# Patient Record
Sex: Female | Born: 1985 | Race: White | Hispanic: No | Marital: Single | Smoking: Never smoker
Health system: Southern US, Community
[De-identification: ages and names within clinical notes are randomized; demographics above are authoritative.]

---
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
## 2006-01-26 ENCOUNTER — Emergency Department (HOSPITAL_COMMUNITY): Admission: EM | Admit: 2006-01-26 | Discharge: 2006-01-26 | Payer: Self-pay | Admitting: Emergency Medicine

## 2008-09-29 ENCOUNTER — Observation Stay (HOSPITAL_COMMUNITY): Admission: EM | Admit: 2008-09-29 | Discharge: 2008-10-02 | Payer: Self-pay | Admitting: Emergency Medicine

## 2008-09-29 ENCOUNTER — Ambulatory Visit: Payer: Self-pay | Admitting: Internal Medicine

## 2008-10-12 ENCOUNTER — Ambulatory Visit: Payer: Self-pay | Admitting: Gynecology

## 2008-11-04 ENCOUNTER — Ambulatory Visit: Payer: Self-pay | Admitting: Gynecology

## 2009-02-24 ENCOUNTER — Encounter: Admission: RE | Admit: 2009-02-24 | Discharge: 2009-02-24 | Payer: Self-pay | Admitting: Internal Medicine

## 2010-02-01 ENCOUNTER — Encounter: Payer: Self-pay | Admitting: Family Medicine

## 2010-02-17 ENCOUNTER — Ambulatory Visit: Payer: Self-pay | Admitting: Sports Medicine

## 2010-02-17 DIAGNOSIS — M21069 Valgus deformity, not elsewhere classified, unspecified knee: Secondary | ICD-10-CM | POA: Insufficient documentation

## 2010-02-17 DIAGNOSIS — R269 Unspecified abnormalities of gait and mobility: Secondary | ICD-10-CM | POA: Insufficient documentation

## 2010-02-17 DIAGNOSIS — M2140 Flat foot [pes planus] (acquired), unspecified foot: Secondary | ICD-10-CM | POA: Insufficient documentation

## 2010-02-17 DIAGNOSIS — M79609 Pain in unspecified limb: Secondary | ICD-10-CM | POA: Insufficient documentation

## 2010-05-05 ENCOUNTER — Emergency Department (HOSPITAL_COMMUNITY): Admission: EM | Admit: 2010-05-05 | Discharge: 2010-05-05 | Payer: Self-pay | Admitting: Emergency Medicine

## 2010-12-20 NOTE — Assessment & Plan Note (Signed)
Summary: ORTHOTICS,MC   Vital Signs:  Patient profile:   25 year old female Height:      67 inches Weight:      165 pounds BMI:     25.94 BP sitting:   106 / 69  Vitals Entered By: Lillia Pauls CMA (February 17, 2010 2:42 PM)  History of Present Illness: Pt presents for left mid arch and ankle pain that has gotten worse as she has been training for half marathon. She has felt the pain getting worse since she was seen at [facility] for long arch collapse and pes planus. Her half marathon is the [employer] at the end of April. The pain typically develops after she starts her run and persists during her run. She has been training quite a bit and has run already up to 17 miles. She has also developed some left knee and hip pain since she developed the foot and ankle pain. She describes the pain as pressure-like.  Allergies (verified): No Known Drug Allergies  Physical Exam  General:  alert and well-developed.   Head:  normocephalic and atraumatic.   Neck:  supple.   Lungs:  normal respiratory effort.   Msk:  Ankle and Feet: Pes planus bilaterally Hindfoot valgus of the left foot Pronates with her right foot Significant genu valgum, left worse than right dynamically Bunionettes bilaterally Normal inspection otherwise Full ROM of ankles in all direction 5/5 strength with resisted ankle ROM testing No TTP throughout Able to bear weight easily  Gait: Significant genu valgum bilaterally, left worse than right. Lateral motion of bilateral knees when walking.   Hip: 4/5 strength with resisted hip flexion and abduction   Impression & Recommendations:  Problem # 1:  PES PLANUS (ICD-734)  1. Made custom orthotics for the patient so that she can run in the race later this month  Patient was fitted for a : standard, cushioned, semi-rigid orthotic. The orthotic was heated and afterward the patient stood on the orthotic blank positioned on the orthotic stand. The patient was  positioned in subtalar neutral position and 10 degrees of ankle dorsiflexion in a weight bearing stance. After completion of molding, a stable base was applied to the orthotic blank. The blank was ground to a stable position for weight bearing. Size: 9 red cushioned Base: Med blue EVA Posting: Arch support Additional orthotic padding: Heel wedge under the left orthotic  Comfortable prior to leaving the office  Orders: Orthotic Materials, each unit (L3002)  Problem # 2:  GENU VALGUM (ICD-736.41)  1. Given heel wedge for her left orthotic 2. Given hip strengthening exercise to do 3. Return in 4 weeks for follow-up  Orders: Orthotic Materials, each unit [phone])  Problem # 3:  FOOT PAIN (ICD-729.5) See above for treatment plan  Problem # 4:  GAIT DISTURBANCE (ICD-781.2)  Orthotics for pes planus and genu valgum  Orders: Orthotic Materials, each unit (U0454)

## 2010-12-20 NOTE — Letter (Signed)
Summary: [employer] at [facility] at [facility]   Imported By: Marily Memos 02/24/2010 15:22:37  _____________________________________________________________________  External Attachment:    Type:   Image     Comment:   External Document

## 2010-12-25 ENCOUNTER — Emergency Department (HOSPITAL_COMMUNITY)
Admission: EM | Admit: 2010-12-25 | Discharge: 2010-12-25 | Disposition: A | Discharge status: Home / Self Care | Payer: PRIVATE HEALTH INSURANCE | Attending: Emergency Medicine | Admitting: Emergency Medicine

## 2010-12-25 DIAGNOSIS — K5900 Constipation, unspecified: Secondary | ICD-10-CM | POA: Insufficient documentation

## 2010-12-25 DIAGNOSIS — R110 Nausea: Secondary | ICD-10-CM | POA: Insufficient documentation

## 2010-12-25 DIAGNOSIS — R109 Unspecified abdominal pain: Secondary | ICD-10-CM | POA: Insufficient documentation

## 2011-01-18 ENCOUNTER — Other Ambulatory Visit: Payer: Self-pay | Admitting: Gastroenterology

## 2011-01-24 ENCOUNTER — Encounter: Payer: Self-pay | Admitting: Family Medicine

## 2011-01-24 ENCOUNTER — Ambulatory Visit (INDEPENDENT_AMBULATORY_CARE_PROVIDER_SITE_OTHER): Payer: PRIVATE HEALTH INSURANCE | Admitting: Family Medicine

## 2011-01-24 DIAGNOSIS — M7750 Other enthesopathy of unspecified foot: Secondary | ICD-10-CM

## 2011-01-24 DIAGNOSIS — M722 Plantar fascial fibromatosis: Secondary | ICD-10-CM

## 2011-01-24 DIAGNOSIS — R269 Unspecified abnormalities of gait and mobility: Secondary | ICD-10-CM

## 2011-01-27 ENCOUNTER — Other Ambulatory Visit: Payer: PRIVATE HEALTH INSURANCE

## 2011-01-31 NOTE — Assessment & Plan Note (Signed)
Summary: NP,RT FOOT PAIN PER Briah Nary,MC   Vital Signs:  Patient profile:   25 year old female Height:      67 inches Weight:      163 pounds BMI:     25.62 Pulse rate:   77 / minute BP sitting:   109 / 69  (right arm)  Vitals Entered By: Rochele Pages RN (January 24, 2011 4:04 PM) CC: RT FOOT 2ND METATARSAL AND TOE PAIN   CC:  RT FOOT 2ND METATARSAL AND TOE PAIN.  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  Has experienced increasing Rt foot pain along 2nd MT & 2nd toe x 34-months.  Started while training for a 1/2 marathon, but has not been improving.  Denies any specific injury.  Denies any significant swelling or bruising.  Has been unable to run due to the pain, but has been biking & using elliptical.  Was seen by me 1-week ago at [facility] and felt to be related to metatarsalgia.  She has custom orthotics made in our office about a year ago, but did not have them for examination at [facility] office visit.  Was previously running about 40-miles/wk.  Also c/o left heel pain related to plantar fasciitis.  Orthotics have been somewhat helpful.  Is performing ice massage periodically with frozen water bottle.  Is not doing any PF exercises/stretches.  This is currently less bothersome than right foot pain.  Preventive Screening-Counseling & Management  Alcohol-Tobacco     Smoking Status: never  Social History: Smoking Status:  never  Review of Systems       per HPI, otherwise ROS negative  Physical Exam  General:  Well-developed,well-nourished,in no acute distress; alert,appropriate and cooperative throughout examination Msk:  ANKLES: FROM b/l without pain.  no tenderness, weakness, or laxity.  FEET:  - R foot: pes planus, over pronation, collapse of transverse arch, hindfoot valgus.  Callous along 2nd & 3rd MT heads, TTP along 2nd & 3rd MT heads.  Mildly TTP at 2nd MTP joint & along 2nd MT.  Neg metatarsal squeeze.   Able to perform hop test with some pain.  Minimal tenderness along insertion of PF.  - L foot: pes planus, over pronation, mild collapse of transverse arch with slight splaying between 1st & 2nd toes, hindfoot valgus.  No significant callous formation.  No tenderness along MT heads, MTs, or midfoot.  TTP along insertion of PF along medial calcaneous.  GAIT: no leg length difference.  b/l genu valgum, over pronation b/l - right>left.  Good running form, no limp.  Good hip ROM & good hip strength Pulses:  2+ DP & PT b/l Neurologic:  sensation intact to light touch.   Additional Exam:  Exam of custom orthotics shows them to be in good shape.  Pt removed medial heel wedge from left insert several months ago.  MSK U/S: Exam of right foot showed normal appearing MTs without any cortical irregularities or surrounding edema.  No signs of fracture.  Slight amount of increase fluid at 2nd MTP joint, but no signs of fracture.  No increased doppler flow in this area.  PF measured 0.30 cm.  Images saved.  Exam of left foot showed slightly thickened PF compared to right, measuring 0.40 cm.  No signs of tearing.  Images saved.   Impression & Recommendations:  Problem # 1:  METATARSALGIA (ICD-726.70)  - Metatarsalgia of right foot along 2nd/3rd MTs, may have  mild synovitis at 2nd MTP joint based on MSK u/s, no signs of stress fx - Orthotic fitted with MT pad which was comfortable in office.  Given additional pads to place in shoes that orthotics will not fit into - Also fitted with arch straps - Cont mobic prn - May start to ease back into running, reviewed walk/run program.  Cont. cross-training - f/u 3 weeks for re-evaluation  Orders: [place] LIMITED ([id number]) Metatarsal Pads (J8119) Posting Heel Wedges (L3350)  Problem # 2:  FASCIITIS, PLANTAR (ICD-728.71) - Mainly on left, mild tenderness on right.  MSK u/s showed thickness 0.4cm on left & 0.30 cm on right - Cont. to use custom orthotics & wear  supportive footwear - Fitted with arch straps today - Cont. ice massages - Given handout & reviewed PF exercises - should do these twice daily - cont Mobic prn - f/u 3 weeks for re-evaluation  Orders: Foot Orthosis ( Arch Strap/Heel Cup) [phone]) [place] LIMITED [phone]) Metatarsal Pads [phone]) Posting Heel Wedges [phone])  Problem # 3:  ABNORMALITY OF GAIT (ICD-781.2)  - b/l over pronation & b/l genu valgum - orthotics adjusted with medial heel wedge b/l - correction comfortable to patient in office & gait more neutral  Orders: [place] LIMITED (62952) Metatarsal Pads (W4132) Posting Heel Wedges (G4010)   Orders Added: 1)  Foot Orthosis ( Arch Strap/Heel Cup) [U7253] 2)  Est. Patient Level III [66440] 3)  [place] LIMITED [[id number]] 4)  Metatarsal Pads [L3649] 5)  Posting Heel Wedges [L3350]

## 2011-02-06 LAB — DIFFERENTIAL
Eosinophils Absolute: 0.2 10*3/uL (ref 0.0–0.7)
Eosinophils Relative: 2 % (ref 0–5)
Lymphs Abs: 2.4 10*3/uL (ref 0.7–4.0)
Monocytes Relative: 7 % (ref 3–12)
Neutro Abs: 3.6 10*3/uL (ref 1.7–7.7)
Neutrophils Relative %: 54 % (ref 43–77)

## 2011-02-06 LAB — URINALYSIS, ROUTINE W REFLEX MICROSCOPIC
Bilirubin Urine: NEGATIVE
Hgb urine dipstick: NEGATIVE
Urobilinogen, UA: 0.2 mg/dL (ref 0.0–1.0)

## 2011-02-06 LAB — BASIC METABOLIC PANEL
BUN: 11 mg/dL (ref 6–23)
CO2: 27 mEq/L (ref 19–32)
Creatinine, Ser: 0.73 mg/dL (ref 0.4–1.2)
GFR calc non Af Amer: >60 mL/min (ref >60)

## 2011-02-06 LAB — CBC
Hemoglobin: 14.9 g/dL (ref 12.0–15.0)
MCHC: 34.5 g/dL (ref 30.0–36.0)

## 2011-03-04 ENCOUNTER — Emergency Department (HOSPITAL_COMMUNITY): Payer: PRIVATE HEALTH INSURANCE

## 2011-03-04 ENCOUNTER — Emergency Department (HOSPITAL_COMMUNITY)
Admission: EM | Admit: 2011-03-04 | Discharge: 2011-03-04 | Disposition: A | Discharge status: Home / Self Care | Payer: PRIVATE HEALTH INSURANCE | Attending: Emergency Medicine | Admitting: Emergency Medicine

## 2011-03-04 ENCOUNTER — Encounter (HOSPITAL_COMMUNITY): Payer: Self-pay | Admitting: Radiology

## 2011-03-04 DIAGNOSIS — R10819 Abdominal tenderness, unspecified site: Secondary | ICD-10-CM | POA: Insufficient documentation

## 2011-03-04 DIAGNOSIS — K5900 Constipation, unspecified: Secondary | ICD-10-CM | POA: Insufficient documentation

## 2011-03-04 DIAGNOSIS — F411 Generalized anxiety disorder: Secondary | ICD-10-CM | POA: Insufficient documentation

## 2011-03-04 DIAGNOSIS — J45909 Unspecified asthma, uncomplicated: Secondary | ICD-10-CM | POA: Insufficient documentation

## 2011-03-04 DIAGNOSIS — R109 Unspecified abdominal pain: Secondary | ICD-10-CM | POA: Insufficient documentation

## 2011-03-04 LAB — COMPREHENSIVE METABOLIC PANEL
AST: 27 U/L (ref 0–37)
BUN: 14 mg/dL (ref 6–23)
Calcium: 8.9 mg/dL (ref 8.4–10.5)
Chloride: 105 mEq/L (ref 96–112)
GFR calc Af Amer: >60 mL/min (ref >60)
GFR calc non Af Amer: >60 mL/min (ref >60)
Glucose, Bld: 112 mg/dL — ABNORMAL HIGH (ref 70–99)
Sodium: 139 mEq/L (ref 135–145)
Total Protein: 6 g/dL (ref 6.0–8.3)

## 2011-03-04 LAB — URINALYSIS, ROUTINE W REFLEX MICROSCOPIC
Glucose, UA: NEGATIVE mg/dL
Nitrite: NEGATIVE

## 2011-03-04 LAB — DIFFERENTIAL
Eosinophils Absolute: 0.3 10*3/uL (ref 0.0–0.7)
Eosinophils Relative: 4 % (ref 0–5)
Lymphocytes Relative: 34 % (ref 12–46)
Lymphs Abs: 2.2 10*3/uL (ref 0.7–4.0)
Monocytes Absolute: 0.4 10*3/uL (ref 0.1–1.0)
Neutro Abs: 3.5 10*3/uL (ref 1.7–7.7)

## 2011-03-04 LAB — CBC
Hemoglobin: 14.8 g/dL (ref 12.0–15.0)
MCHC: 33.7 g/dL (ref 30.0–36.0)
MCV: 92.6 fL (ref 78.0–100.0)
RBC: 4.74 MIL/uL (ref 3.87–5.11)
WBC: 6.4 10*3/uL (ref 4.0–10.5)

## 2011-03-04 MED ORDER — IOHEXOL 300 MG/ML  SOLN
100.0000 mL | Freq: Once | INTRAMUSCULAR | Status: AC | PRN
  Administered 2011-03-04: 90 mL via INTRAVENOUS

## 2011-04-04 NOTE — Discharge Summary (Signed)
Robyn Garcia, Robyn Garcia   MEDICAL RECORD NO.:  [id number]          PATIENT TYPE:  OBV   LOCATION:  5030                         FACILITY:  [facility]   PHYSICIAN:  Alvester Morin, M.D.  DATE OF BIRTH:  Jun 15, 1986   DATE OF ADMISSION:  09/29/2008  DATE OF DISCHARGE:  10/02/2008                               DISCHARGE SUMMARY   DISCHARGE DIAGNOSES:  1. Anemia is likely secondary to menorrhagia.  2. Thrombocytopenia.  3. Uterine fibroid.  4. Abdominal pain.   DISCHARGE MEDICATIONS:  1. Ferrous sulfate 325 mg 1 tablet 5 mL  three times daily.  2. Vicodin 1 tablet q.6 h. p.r.n. pain.  3. MiraLax 70 mg p.o. daily.  4. Protonix 20 mg p.o. daily.  5. Docusate 100 mg p.o. b.i.d.   CONDITION AT DISCHARGE:  Stable.   FOLLOWUP:  The patient to follow up with Dr. Sherron Monday on Friday,  October 09, 2008, at 1 p.m.   PROCEDURES:  1. Abdominal CT was read as normal other than the enlarged uterus.  2. Pelvic ultrasound showed a mass in the uterus with differential      that included a large submucosal leiomyoma versus an endometrial      polyp.   CONSULTATION:  Sherron Monday, MD.   CHIEF COMPLAINT:  On admission, abdominal pain.   HISTORY OF PRESENT ILLNESS:  This is a 25 year old Caucasian female with  no significant past medical history who presents with intense left upper  quadrant epigastric abdominal pain.  She has had intermittent diarrhea  and nausea, but no vomiting.  She denies hematemesis and melena.  She  also denied fevers and chills.  She had a recent flu-like illness in  September with fever, chills, and sore throat.  She also had a recent GI  illness with vomiting and diarrhea for 1 to 2 weeks when returning from  a trip from [place].  This GI illness was treated with antibiotics.  She  had routine labs in the emergency department which showed a  hemoglobin  of 6.1.  A repeat CBC showed a hemoglobin of 5.8.  The patient says that  she has  had normal menstrual period until 1 year ago.  It then became  irregular with heavy bleeding.  She has occasional dysmenorrhagia.  She  says that she has had no period for the past 2 months.  She also  complains of inability to exercise because her legs become heavy.   ALLERGIES:  No known drug allergies.   MEDICATIONS ON ADMISSION:  Occasional ibuprofen.   SOCIAL HISTORY:  The patient denies tobacco, alcohol, and drug use.  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  She lives with her mother.  Her family is from [place], but has lived in the [place] in 2000.   FAMILY HISTORY:  No history of bleeding disorders or gynecological  disorders in the family.   PHYSICAL EXAMINATION:  VITAL SIGNS:  Temperature 97.1, blood pressure  106/80, pulse 60, respiratory rate 22, O2 saturation 100% on room air.  GENERAL:  Pale in no apparent distress.  NECK:  Supple.  Thyroid is uniform to palpation.  RESPIRATORY:  Lungs are clear to auscultation bilaterally.  CARDIOVASCULAR:  Regular rate and rhythm.  No murmurs, rubs, or gallops.  GI:  Abdomen is soft and nondistended.  Tender to palpation in  epigastric and left upper quadrant regions.  Uterus is palpable to the  umbilicus.  SKIN:  There are no rashes.  PELVIC:  Challenging given the patient's intact hymen.  RECTAL:  Normal.  No masses palpated.  No blood.  NEUROLOGIC:  Nonfocal.  Alert and oriented x4.   LABS ON ADMISSION:  White cell count 5.9, hemoglobin 6.1, hematocrit 22,  platelet count of 126, ANC 3.6, MCV 53.7, and RDW 25.2.  Smear showed  target cells and elliptocyte.  Iron less than 10, folate 14.7, ferritin  1, vitamin B12 835, PT 14.5, INR 1.1, and PTT 27.  Urine pregnancy test  negative.  UA negative.  Fecal occult blood negative.  Sodium 139,  potassium 3.6, chloride 107, bicarb 27, BUN 6, creatinine 0.64, glucose  98, total bilirubin 0.5, total protein 5.9, albumin 3.4, alkaline  phosphatase 44, AST 19, ALT 14, lipase 21, and  calcium 8.9.   HOSPITAL COURSE:  1. Abdominal pain of unclear etiology.  Given the normal LFTs and      normal lipase, hepatitis and pancreatitis were unlikely.      Splenomegaly was also considered in the differential, however,      spleen was of normal size.  On CT scan, this is determined that the      abdominal pain is likely secondary to compression from her enlarged      uterus.  The patient was treated with morphine for the abdominal      pain.  On October 01, 2008, the patient complained of worsening      abdominal pain and abdominal x-ray was performed and showed      constipation.  A Fleet Enema was ordered, but the patient refused.      The patient was discharged on MiraLax and docusate.   1. Microcytic anemia.  This is likely secondary to the patient's      menorrhagia.  Hematology/Oncology was consulted, given the      patient's anemia, they said it was likely secondary to iron      deficiency anemia.  The patient was transfused 2 units of packed      red blood cells on the day of admission and was started on oral      iron supplementation.  The patient's hemoglobin remained stable      throughout admission.  2. Thrombocytopenia.  The initial CBC performed on admission showed      platelets of 126.  Repeat CBC showed 113.  On day 2 of admission,      platelet lowered to 86.  Again, Hematology/Oncology was consulted      and Dr. Darnelle Catalan said that the thrombocytopenia is secondary to      iron deficiency and consumption from the patient's menorrhagia.      Iron supplementation was given orally.  3. Uterine mass.  CT and pelvic ultrasound were performed to evaluate      uterine mass.  Gynecology was consulted and they believe the mass      most likely be a fibroid or leiomyoma.  The patient is to follow up      with Dr. Ellyn Hack as an outpatient for surgical removal of the      fibroid.  DISCHARGE LABS AND VITALS:  Vitals on the day of discharge; temperature  97.9, blood  pressure 103/53, pulse 65, and O2 saturation 99% on room  air.  Labs on day of discharge; magnesium 2.2, ANA negative, and GBS  negative.  There are no pending laboratory.      Hartley Barefoot, MD  Electronically Signed      Alvester Morin, M.D.  Electronically Signed    BR/MEDQ  D:  10/05/2008  T:  10/06/2008  Job:  [id number]

## 2011-08-23 LAB — DIFFERENTIAL
Basophils Absolute: 0
Basophils Absolute: 0.1
Basophils Relative: 0
Basophils Relative: 1
Basophils Relative: 1
Eosinophils Absolute: 0.1
Eosinophils Absolute: 0.1
Eosinophils Relative: 1
Eosinophils Relative: 1
Lymphocytes Relative: 26
Lymphs Abs: 1.8
Monocytes Absolute: 0.4
Monocytes Relative: 8
Monocytes Relative: 8
Neutrophils Relative %: 50
Neutrophils Relative %: 64

## 2011-08-23 LAB — TYPE AND SCREEN

## 2011-08-23 LAB — CBC
HCT: 20.7 — ABNORMAL LOW
HCT: 22 — ABNORMAL LOW
HCT: 25.2 — ABNORMAL LOW
HCT: 26.1 — ABNORMAL LOW
Hemoglobin: 6.1 — CL
Hemoglobin: 7.4 — CL
Hemoglobin: 7.7 — CL
Hemoglobin: 7.9 — CL
MCV: 52.7 — ABNORMAL LOW
MCV: 53.7 — ABNORMAL LOW
MCV: 61.4 — ABNORMAL LOW
Platelets: 101 — ABNORMAL LOW
Platelets: 126 — ABNORMAL LOW
Platelets: 150
Platelets: 86 — ABNORMAL LOW
RBC: 3.93
RBC: 4.26
RDW: 36.1 — ABNORMAL HIGH
RDW: 36.9 — ABNORMAL HIGH
WBC: 5
WBC: 5.4
WBC: 5.6
WBC: 5.8
WBC: 5.9
WBC: 6.8

## 2011-08-23 LAB — APTT: aPTT: 27

## 2011-08-23 LAB — BASIC METABOLIC PANEL
BUN: 15
Chloride: 108
Chloride: 108
GFR calc Af Amer: >60
GFR calc non Af Amer: >60
GFR calc non Af Amer: >60
Glucose, Bld: 98
Potassium: 3.6
Potassium: 3.8
Sodium: 138
Sodium: 138

## 2011-08-23 LAB — COMPREHENSIVE METABOLIC PANEL
AST: 19
Albumin: 3.7
Alkaline Phosphatase: 44
Chloride: 107
GFR calc Af Amer: >60
Potassium: 3.6
Sodium: 139
Total Bilirubin: 0.5
Total Protein: 5.9 — ABNORMAL LOW

## 2011-08-23 LAB — HAPTOGLOBIN: Haptoglobin: 45

## 2011-08-23 LAB — HIV ANTIBODY (ROUTINE TESTING W REFLEX): HIV: NONREACTIVE

## 2011-08-23 LAB — IRON AND TIBC
Iron: <10 — ABNORMAL LOW
UIBC: 368

## 2011-08-23 LAB — OCCULT BLOOD X 1 CARD TO LAB, STOOL: Fecal Occult Bld: NEGATIVE

## 2011-08-23 LAB — URINALYSIS, ROUTINE W REFLEX MICROSCOPIC
Bilirubin Urine: NEGATIVE
Urobilinogen, UA: 0.2

## 2011-08-23 LAB — FOLATE: Folate: 14.7

## 2011-08-23 LAB — PROTIME-INR: INR: 1.1

## 2011-08-23 LAB — URINE DRUGS OF ABUSE SCREEN W ALC, ROUTINE (REF LAB)
Barbiturate Quant, Ur: NEGATIVE
Benzodiazepines.: NEGATIVE
Marijuana Metabolite: NEGATIVE
Propoxyphene: NEGATIVE

## 2011-08-23 LAB — POCT I-STAT, CHEM 8
BUN: 11
Hemoglobin: 8.5 — ABNORMAL LOW
Sodium: 138
TCO2: 25

## 2011-08-23 LAB — PREPARE RBC (CROSSMATCH)

## 2011-08-23 LAB — FERRITIN: Ferritin: 1 — ABNORMAL LOW (ref 10–291)

## 2011-08-23 LAB — HCG, QUANTITATIVE, PREGNANCY: hCG, Beta Chain, Quant, S: <2

## 2011-08-23 LAB — ANA: Anti Nuclear Antibody(ANA): NEGATIVE

## 2011-08-23 LAB — LIPASE, BLOOD: Lipase: 21

## 2011-08-23 LAB — RETICULOCYTES
RBC.: 3.87
Retic Count, Absolute: 65.8

## 2011-08-23 LAB — LACTATE DEHYDROGENASE: LDH: 134

## 2011-08-23 LAB — VITAMIN B12: Vitamin B-12: 835 (ref 211–911)

## 2011-08-23 LAB — POCT PREGNANCY, URINE: Preg Test, Ur: NEGATIVE

## 2018-07-26 ENCOUNTER — Ambulatory Visit (INDEPENDENT_AMBULATORY_CARE_PROVIDER_SITE_OTHER): Payer: 59 | Admitting: Podiatry

## 2018-07-26 ENCOUNTER — Encounter: Payer: Self-pay | Admitting: Podiatry

## 2018-07-26 ENCOUNTER — Ambulatory Visit (INDEPENDENT_AMBULATORY_CARE_PROVIDER_SITE_OTHER): Payer: 59

## 2018-07-26 ENCOUNTER — Ambulatory Visit: Payer: 59

## 2018-07-26 VITALS — BP 109/64 | HR 65 | Resp 16

## 2018-07-26 DIAGNOSIS — M722 Plantar fascial fibromatosis: Secondary | ICD-10-CM

## 2018-07-26 DIAGNOSIS — M779 Enthesopathy, unspecified: Secondary | ICD-10-CM | POA: Diagnosis not present

## 2018-07-26 DIAGNOSIS — M25571 Pain in right ankle and joints of right foot: Secondary | ICD-10-CM

## 2018-07-26 DIAGNOSIS — G5752 Tarsal tunnel syndrome, left lower limb: Secondary | ICD-10-CM | POA: Diagnosis not present

## 2018-07-26 MED ORDER — DICLOFENAC SODIUM 75 MG PO TBEC: 75.0000 mg | DELAYED_RELEASE_TABLET | Freq: Two times a day (BID) | ORAL | 2 refills | Status: AC

## 2018-07-26 MED ORDER — TRIAMCINOLONE ACETONIDE 10 MG/ML IJ SUSP
10.0000 mg | Freq: Once | INTRAMUSCULAR | Status: AC
  Administered 2018-07-26: 10 mg

## 2018-07-26 NOTE — Patient Instructions (Signed)
Tarsal Tunnel Syndrome Tarsal tunnel syndrome is a condition that happens when a nerve (tibial nerve) is irritated or squeezed (compressed) as it passes through an area on the inside of your ankle (tarsal tunnel). The tarsal tunnel is a narrow passage through the connective tissue and bones in your feet (tarsals). The tibial nerve passes behind the large bony bump at your inner ankle (medial malleolus) and sends branches to your foot and toes. This nerve helps supply feeling to your heel, to the bottom of your foot, and to some of your toe muscles. Tarsal tunnel syndrome usually causes ankle and foot pain that gets worse with activity. What are the causes? Tarsal tunnel syndrome can be caused by any condition that narrows the space in the tarsal tunnel. Athletes may get tarsal tunnel syndrome from a fractured ankle or from an outward (eversion) ankle sprain that results in scarring or swelling. Other common causes include:  Over-pronation. This is when your feet roll in and flatten too much when you stand, walk, or run.  Extra pressure on the tarsal tunnel area from tight or stiff shoes or boots.  Decreased room in the tarsal tunnel due to small, fluid-filled sacs (cysts) or growths on the bones near the tunnel (exostosis).  What increases the risk? This condition is more likely to develop in people who:  Play sports where they wear high, stiff boots, such as downhill skiing.  Play sports with repetitive motion, such as running.  Play sports on uneven surfaces that can lead to a sprained ankle, such as soccer or football.  Have had an inner ankle injury.  Have flat feet.  Have a condition such as diabetes, hypothyroidism, or rheumatoid arthritis.  What are the signs or symptoms? Symptoms of this condition include:  Burning pain behind the ankle, in the heel, or in the foot that gets worse if you are standing, walking, or running.  Numbness or a tingling sensation (pins and needles) in  your heel, foot, or toes.  At first, your symptoms may get worse with activity and be relieved with rest. Over time, your symptoms may become constant or come on sooner with less activity. How is this diagnosed? This condition is diagnosed based on your symptoms, medical history, and physical exam. During the exam, your health care provider may tap on the area below your ankle to check for tingling in your foot or toes. Your health care provider may also inject numbing medicine into the tarsal tunnel to see if it relieves your pain. You may also have other tests, including:  X-rays to check bone structure.  MRI or ultrasound to examine nerve and tendon structures and find where your nerve is getting compressed.  An electrical study of nerve function (electromyography, or EMG).  How is this treated? Treatment may include:  Wearing a removable splint or boot for ankle support.  Using a shoe insert (orthotic) to help support your arch.  Using ice to reduce swelling.  Taking pain medicine.  Having medicine injected into your ankle joint to reduce pain and swelling.  Starting range-of-motion exercises and strengthening exercises.  Gradually returning to full activity. The timing will depend on the severity of your condition and your response to treatment.  You may need surgery if there is a soft tissue growth or a bone growth, or if other treatments have not helped. After surgery, you may need to wear a removable splint or boot for support. You also may need physical therapy. Follow these instructions at   home: If you have a splint or boot:  Wear the splint or boot as told by your health care provider. Remove it only as told by your health care provider.  Loosen the splint or boot if your toes tingle, become numb, or turn cold and blue.  If your splint or boot is not waterproof: ? Do not let it get wet. ? Cover it with a watertight covering when you take a bath or shower.  Keep the  splint or boot clean. Managing pain, stiffness, and swelling  If directed, put ice on the injured area. ? Put ice in a plastic bag. ? Place a towel between your skin and the bag. ? Leave the ice on for 20 minutes, 2-3 times a day.  Move your toes often to avoid stiffness and to lessen swelling.  Raise (elevate) your foot above the level of your heart while you are sitting or lying down. Driving  Ask your health care provider when it is safe to drive if you have a splint or boot on a foot that you use for driving.  Do not drive or operate heavy machinery while taking prescription pain medicine. Activity  Return to your normal activities as told by your health care provider. Ask your health care provider what activities are safe for you.  Do exercises as told by your health care provider. General instructions  Do not use any tobacco products, such as cigarettes, chewing tobacco, and e-cigarettes. Tobacco can delay healing. If you need help quitting, ask your health care provider.  Take over-the-counter and prescription medicines only as told by your health care provider.  Keep all follow-up visits as told by your health care provider. This is important. How is this prevented?  Give your body time to rest between periods of activity.  Make sure to wear supportive and comfortable shoes during athletic activity.  Do not over-tighten ski boots or the laces on high-top shoes.  Be safe and responsible while being active to avoid falls. Contact a health care provider if:  Your ankle pain is not getting better.  You are unable to support (bear) body weight on your foot without pain. This information is not intended to replace advice given to you by your health care provider. Make sure you discuss any questions you have with your health care provider. Document Released: 11/06/2005 Document Revised: 07/12/2016 Document Reviewed: 10/19/2015 Elsevier Interactive Patient Education  2018  Elsevier Inc.  

## 2018-07-26 NOTE — Progress Notes (Signed)
Subjective:   Patient ID: Robyn Garcia, female   DOB: 32 y.o.   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  States that she started to develop a lot of pain again in her forefoot as she increased activity in the last several months.  Patient does not smoke and likes to be active   Review of Systems  All other systems reviewed and are negative.       Objective:  Physical Exam  Constitutional: She appears well-developed and well-nourished.  Cardiovascular: Intact distal pulses.  Pulmonary/Chest: Effort normal.  Musculoskeletal: Normal range of motion.  Neurological: She is alert.  Skin: Skin is warm.  Nursing note and vitals reviewed.   Neurovascular status intact muscle strength is adequate range of motion within normal limits with patient found to have moderate fascial pain bilateral but exquisite discomfort second metatarsal phalangeal joint left with inflammation fluid of the joint surface.  Patient is noted to have good digital perfusion and is well oriented x3     Assessment:  Acute capsulitis second MPJ left with mild to moderate plantar fasciitis with moderate depression of the arch with history of heavy duty running and patient wants to begin running again     Plan:  H&P x-rays reviewed and today and focusing on the forefoot.  I did do a proximal nerve block I aspirated the joint getting out a small amount of pink fluid indicating there may have been a small amount of damage to the joint and I injected quarter cc dexamethasone Kenalog to reduce inflammation and applied thick padding to reduce stress against the joint surface.  Discussed long-term orthotics with patient and we will see her back again in the next week to see result and did  dispense a metatarsal pad and placed on diclofenac 75 mg twice daily  X-ray indicates no signs of stress fracture arthritis or spur formation

## 2018-07-26 NOTE — Progress Notes (Signed)
   Subjective:    Patient ID: Robyn Garcia, female    DOB: 1986-04-02, 32 y.o.   MRN: [id number]  HPI    Review of Systems  All other systems reviewed and are negative.      Objective:   Physical Exam        Assessment & Plan:

## 2018-08-01 ENCOUNTER — Ambulatory Visit (INDEPENDENT_AMBULATORY_CARE_PROVIDER_SITE_OTHER): Payer: 59 | Admitting: Podiatry

## 2018-08-01 ENCOUNTER — Encounter: Payer: Self-pay | Admitting: Podiatry

## 2018-08-01 DIAGNOSIS — M7751 Other enthesopathy of right foot: Secondary | ICD-10-CM

## 2018-08-01 DIAGNOSIS — M7752 Other enthesopathy of left foot: Secondary | ICD-10-CM | POA: Diagnosis not present

## 2018-08-01 DIAGNOSIS — M779 Enthesopathy, unspecified: Secondary | ICD-10-CM

## 2018-08-01 NOTE — Progress Notes (Signed)
Subjective:   Patient ID: Robyn CoupeKimberly Frampton, female   DOB: 32 y.o.   MRN: [id number]   HPI Patient states she is feeling a lot better with the diminishment of her discomfort but she would like to be able to start running and she knows she is going to need some kind of insert therapy   ROS      Objective:  Physical Exam  Neurovascular status intact with continued mild symptoms around the second MPJ left with improvement but pain upon deep palpation     Assessment:  Inflammatory capsulitis second MPJ left improved but present     Plan:  Reviewed condition at great length and today we scanned for orthotics to try to reduce forefoot pressure and discussed shoes with rigid bottom soles and reappoint when orthotics are ready by ped orthotist and we will try to offload the second metatarsal left and utilize thick metatarsal padding

## 2018-08-04 ENCOUNTER — Encounter: Payer: Self-pay | Admitting: Podiatry

## 2018-08-05 ENCOUNTER — Emergency Department (HOSPITAL_COMMUNITY)
Admission: EM | Admit: 2018-08-05 | Discharge: 2018-08-06 | Disposition: A | Discharge status: Home / Self Care | Payer: 59 | Attending: Emergency Medicine | Admitting: Emergency Medicine

## 2018-08-05 ENCOUNTER — Other Ambulatory Visit: Payer: Self-pay

## 2018-08-05 DIAGNOSIS — R1084 Generalized abdominal pain: Secondary | ICD-10-CM | POA: Insufficient documentation

## 2018-08-05 DIAGNOSIS — Z79899 Other long term (current) drug therapy: Secondary | ICD-10-CM | POA: Diagnosis not present

## 2018-08-05 LAB — COMPREHENSIVE METABOLIC PANEL
ALBUMIN: 4.3 g/dL (ref 3.5–5.0)
ALK PHOS: 64 U/L (ref 38–126)
ALT: 13 U/L (ref 0–44)
AST: 20 U/L (ref 15–41)
Anion gap: 10 (ref 5–15)
BILIRUBIN TOTAL: 0.7 mg/dL (ref 0.3–1.2)
BUN: 9 mg/dL (ref 6–20)
CALCIUM: 9.4 mg/dL (ref 8.9–10.3)
CO2: 23 mmol/L (ref 22–32)
CREATININE: 0.72 mg/dL (ref 0.44–1.00)
Chloride: 106 mmol/L (ref 98–111)
GFR calc Af Amer: >60 mL/min (ref >60)
GFR calc non Af Amer: >60 mL/min (ref >60)
Glucose, Bld: 98 mg/dL (ref 70–99)
Potassium: 4 mmol/L (ref 3.5–5.1)
Sodium: 139 mmol/L (ref 135–145)
TOTAL PROTEIN: 7.2 g/dL (ref 6.5–8.1)

## 2018-08-05 LAB — CBC
HEMATOCRIT: 49.7 % — AB (ref 36.0–46.0)
Hemoglobin: 16.3 g/dL — ABNORMAL HIGH (ref 12.0–15.0)
MCH: 30.2 pg (ref 26.0–34.0)
MCHC: 32.8 g/dL (ref 30.0–36.0)
MCV: 92 fL (ref 78.0–100.0)
Platelets: 185 10*3/uL (ref 150–400)
RBC: 5.4 MIL/uL — ABNORMAL HIGH (ref 3.87–5.11)
RDW: 12.7 % (ref 11.5–15.5)
WBC: 8.7 10*3/uL (ref 4.0–10.5)

## 2018-08-05 LAB — LIPASE, BLOOD: Lipase: 29 U/L (ref 11–51)

## 2018-08-05 LAB — I-STAT BETA HCG BLOOD, ED (MC, WL, AP ONLY): I-stat hCG, quantitative: <5 m[IU]/mL (ref <5)

## 2018-08-05 MED ORDER — ONDANSETRON HCL 4 MG/2ML IJ SOLN
4.0000 mg | Freq: Once | INTRAMUSCULAR | Status: AC
  Administered 2018-08-05: 4 mg via INTRAVENOUS
  Filled 2018-08-05: qty 2

## 2018-08-05 NOTE — ED Provider Notes (Signed)
MSE was initiated and I personally evaluated the patient and placed orders (if any) at  8:57 PM on August 05, 2018.  The patient appears stable so that the remainder of the MSE may be completed by another provider.  Patient placed in Quick Look pathway, seen and evaluated   Chief Complaint: Abdominal pain  HPI:   Patient is a 31-year female with history of myomectomy, adhesion lysis, and small bowel obstruction presenting for generalized abdominal pain, but more on the right side.  Patient reports that she has not had a bowel movement in 7 days.  She reports that she is also not passing gas. No F/V.  Patient reports nausea with vomiting today.  Patient reports history of small bowel obstruction.  ROS: See HPI (one)  Physical Exam:   Gen: No distress  Neuro: Awake and Alert  Skin: Warm    Focused Exam: Diffuse abdominal tenderness palpation, particularly right side the abdomen.  No focal right lower quadrant tenderness.   Initiation of care has begun. The patient has been counseled on the process, plan, and necessity for staying for the completion/evaluation, and the remainder of the medical screening examination    Delia ChimesMurray, Archita Lomeli B, PA-C 08/05/18 2200    Raeford RazorKohut, Stephen, MD 08/06/18 1531

## 2018-08-05 NOTE — ED Triage Notes (Addendum)
Patient c/o abd pain. Was sent by her provider for having no BM or passing flatus for 7 days. Sent here to r/o bowel obx; hx of same.

## 2018-08-06 ENCOUNTER — Emergency Department (HOSPITAL_COMMUNITY): Payer: 59

## 2018-08-06 MED ORDER — ONDANSETRON 4 MG PO TBDP: 4.0000 mg | ORAL_TABLET | Freq: Three times a day (TID) | ORAL | 0 refills | Status: AC | PRN

## 2018-08-06 MED ORDER — IOHEXOL 300 MG/ML  SOLN
100.0000 mL | Freq: Once | INTRAMUSCULAR | Status: AC | PRN
  Administered 2018-08-06: 100 mL via INTRAVENOUS

## 2018-08-06 MED ORDER — POLYETHYLENE GLYCOL 3350 17 GM/SCOOP PO POWD: 17.0000 g | Freq: Two times a day (BID) | ORAL | 0 refills | Status: AC

## 2018-08-06 NOTE — ED Provider Notes (Signed)
[facility] EMERGENCY DEPARTMENT Provider Note   CSN: [id number] Arrival date & time: 08/05/18  1907     History   Chief Complaint Chief Complaint  Patient presents with  . Abdominal Pain    HPI Robyn Garcia is a 32 y.o. female.  The history is provided by the patient and medical records.  Abdominal Pain   Associated symptoms include nausea.     32 year old female presenting to the ED with abdominal pain.  States about 7 days ago she started having some loose, watery diarrhea with lots of gas.  States afterwards she did not have a bowel movement for several days.  She has had some nausea and decreased appetite but denies vomiting.  Has not really been passing gas.  Has history of myomectomy, abdominal adhesions and bowel obstruction in the past.  She called her primary care doctor today due to her symptoms and was told to come in for evaluation.  She did try taking a laxative at home without reduction of bowel movement.  Prior abdominal surgeries were performed at [facility] as well as some in [place].  No past medical history on file.  Patient Active Problem List   Diagnosis Date Noted  . METATARSALGIA 01/24/2011  . FASCIITIS, PLANTAR 01/24/2011  . FOOT PAIN 02/17/2010  . 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 GENU VALGUM 02/17/2010  . GAIT DISTURBANCE 02/17/2010    No past surgical history on file.   OB History   None      Home Medications    Prior to Admission medications   Medication Sig Start Date End Date Taking? Authorizing Provider  albuterol (PROVENTIL HFA;VENTOLIN HFA) 108 (90 Base) MCG/ACT inhaler Inhale into the lungs.    [provider]  diclofenac (VOLTAREN) 75 MG EC tablet Take 1 tablet (75 mg total) by mouth 2 (two) times daily. 07/26/18   Lenn Sink, DPM  mometasone (ELOCON) 0.1 % cream Apply 1 application topically daily.    [provider]  Vitamin D, Ergocalciferol, (DRISDOL) 50000 units CAPS capsule  06/04/18   [provider]    Family History No family history on file.  Social History Social History   Tobacco Use  . Smoking status: Never Smoker  . Smokeless tobacco: Never Used  Substance Use Topics  . Alcohol use: Not on file  . Drug use: Not on file     Allergies   Patient has no known allergies.   Review of Systems Review of Systems  Gastrointestinal: Positive for abdominal pain and nausea.  All other systems reviewed and are negative.    Physical Exam Updated Vital Signs BP 113/83 (BP Location: Left Arm)   Pulse (!) 57   Resp 18   Ht 5\' 7"  (1.702 m)   Wt 109.8 kg   SpO2 100%   BMI 37.90 kg/m   Physical Exam  Constitutional: She is oriented to person, place, and time. She appears well-developed and well-nourished.  HENT:  Head: Normocephalic and atraumatic.  Mouth/Throat: Oropharynx is clear and moist.  Eyes: Pupils are equal, round, and reactive to light. Conjunctivae and EOM are normal.  Neck: Normal range of motion.  Cardiovascular: Normal rate, regular rhythm and normal heart sounds.  Pulmonary/Chest: Effort normal and breath sounds normal.  Abdominal: Soft. Bowel sounds are normal. There is tenderness in the periumbilical area. There is no rigidity and no guarding.  Very mild tenderness periumbilically, no apparent distention, normal bowel sounds throughout, no rebound or guarding  Musculoskeletal: Normal  range of motion.  Neurological: She is alert and oriented to person, place, and time.  Skin: Skin is warm and dry.  Psychiatric: She has a normal mood and affect.  Nursing note and vitals reviewed.    ED Treatments / Results  Labs (all labs ordered are listed, but only abnormal results are displayed) Labs Reviewed  CBC - Abnormal; Notable for the following components:      Result Value   RBC 5.40 (*)    Hemoglobin 16.3 (*)    HCT 49.7 (*)    All other components within normal limits  LIPASE, BLOOD  COMPREHENSIVE METABOLIC PANEL  URINALYSIS,  ROUTINE W REFLEX MICROSCOPIC  I-STAT BETA HCG BLOOD, ED ([facility], [facility], [facility] ONLY)    EKG None  Radiology Ct Abdomen Pelvis W Contrast  Result Date: 08/06/2018 CLINICAL DATA:  Initial evaluation for acute abdominal pain, concern for bowel obstruction. EXAM: CT ABDOMEN AND PELVIS WITH CONTRAST TECHNIQUE: Multidetector CT imaging of the abdomen and pelvis was performed using the standard protocol following bolus administration of intravenous contrast. CONTRAST:  OMNIPAQUE IOHEXOL 300 MG/ML  SOLN COMPARISON:  Prior CT from 03/04/2011. FINDINGS: Lower chest: Visualized lung bases are clear. Hepatobiliary: Liver demonstrates a normal contrast enhanced appearance. Gallbladder within normal limits. No biliary dilatation. Pancreas: Pancreas within normal limits. Spleen: Spleen within normal limits. Adrenals/Urinary Tract: Adrenal glands are normal. Kidneys cyst equal in size with symmetric enhancement. No nephrolithiasis, hydronephrosis, or focal enhancing renal mass. No hydroureter. Bladder moderately distended without abnormality. Stomach/Bowel: Stomach within normal limits. No evidence for bowel obstruction. Appendix is normal. No abnormal wall thickening, mucosal enhancement, or inflammatory fat stranding seen about the bowels. Moderate stool within the ascending colon. Vascular/Lymphatic: Normal intravascular enhancement seen throughout the intra-abdominal aorta. Mesenteric vessels patent proximally. No adenopathy. Reproductive: Uterus and ovaries within normal limits for age. Other: No free air or fluid. Musculoskeletal: No acute osseus abnormality. No worrisome lytic or blastic osseous lesions. IMPRESSION: No CT evidence for acute intra-abdominal or pelvic process. Moderate stool within the ascending colon without evidence for obstruction. Electronically Signed   By: Rise Mu M.D.   On: 08/06/2018 01:28    Procedures Procedures (including critical care time)  Medications Ordered in  ED Medications  ondansetron (ZOFRAN) injection 4 mg (4 mg Intravenous Given 08/05/18 2030)  iohexol (OMNIPAQUE) 300 MG/ML solution 100 mL (100 mLs Intravenous Contrast Given 08/06/18 0042)     Initial Impression / Assessment and Plan / ED Course  I have reviewed the triage vital signs and the nursing notes.  Pertinent labs & imaging results that were available during my care of the patient were reviewed by me and considered in my medical decision making (see chart for details).  32 year old female presenting to the ED with abdominal pain.  Sent in by PCP for concern for SBO, has hx of same due to adhesions.  She is afebrile, non-toxic.  Abdomen soft and overall benign aside from very mild tenderness periumbilically.  No rebound or guarding, no distention, bowel sounds normal.  Labs overall reassuring.  CT obtained, some moderate stool but no signs of obstruction.  Results discussed with patient, she is somewhat frustrated as she reports "this is what happened last time".  When asked about this further, states she had a CT scan, MRI, and lots of other testing with similar episode of pain years ago, but her bowel obstruction and adhesions were not found until she had laparotomy.  At this point, I do not see any indication  for acute surgical consultation, feel she is stable for outpatient follow-up in [facility]-- given contact information.  Given stool burden noted on CT did recommend bowel regimen with MiraLAX.  She declined pain medication.  She can return here for any new or worsening symptoms.  Final Clinical Impressions(s) / ED Diagnoses   Final diagnoses:  Generalized abdominal pain    ED Discharge Orders         Ordered    ondansetron (ZOFRAN ODT) 4 MG disintegrating tablet  Every 8 hours PRN     08/06/18 0415    polyethylene glycol powder (GLYCOLAX/MIRALAX) powder  2 times daily     08/06/18 0415           Garlon HatchetSanders, Lisa M, PA-C 08/06/18 0500    Zadie RhineWickline, Donald, MD 08/06/18  2310

## 2018-08-06 NOTE — ED Notes (Signed)
ED Provider at bedside. 

## 2018-08-06 NOTE — Discharge Instructions (Signed)
Take the prescribed medication as directed. [facility] with [facility]-- call in the morning to set up for appt. Return to the ED for new or worsening symptoms.

## 2018-08-22 ENCOUNTER — Other Ambulatory Visit: Payer: 59 | Admitting: Orthotics

## 2018-10-01 ENCOUNTER — Ambulatory Visit: Payer: 59 | Admitting: Orthotics

## 2018-10-01 DIAGNOSIS — M779 Enthesopathy, unspecified: Secondary | ICD-10-CM

## 2018-10-01 DIAGNOSIS — M722 Plantar fascial fibromatosis: Secondary | ICD-10-CM

## 2018-10-01 NOTE — Progress Notes (Signed)
Patient came in today to pick up custom made foot orthotics.  The goals were accomplished and the patient reported no dissatisfaction with said orthotics.  Patient was advised of breakin period and how to report any issues. 

## 2018-10-02 ENCOUNTER — Other Ambulatory Visit: Payer: 59 | Admitting: Orthotics

## 2018-12-17 ENCOUNTER — Ambulatory Visit: Payer: Self-pay | Admitting: Orthotics

## 2018-12-17 DIAGNOSIS — G5752 Tarsal tunnel syndrome, left lower limb: Secondary | ICD-10-CM

## 2018-12-17 DIAGNOSIS — M722 Plantar fascial fibromatosis: Secondary | ICD-10-CM

## 2018-12-17 NOTE — Progress Notes (Signed)
Redo LEFT ONLY; change to small met pad

## 2019-01-02 ENCOUNTER — Ambulatory Visit: Payer: Self-pay | Admitting: Orthotics

## 2019-01-02 DIAGNOSIS — M722 Plantar fascial fibromatosis: Secondary | ICD-10-CM

## 2019-01-02 NOTE — Progress Notes (Signed)
ajdusted f/o not back from [place], made adjustements to her current OTS until they get back

## 2019-01-09 ENCOUNTER — Telehealth: Payer: Self-pay | Admitting: Podiatry

## 2019-01-09 NOTE — Telephone Encounter (Signed)
Pt left voicemail stating she picked up her corrected left orthotic on Tuesday and wore it 30 minutes and then again 30 minutes yesterday and had to take it out. Woke up this morning and top of her foot it really red/ swollen and extremely painful. The orthotics are not ok for her to wear. Please call pt

## 2019-01-15 DIAGNOSIS — M7742 Metatarsalgia, left foot: Secondary | ICD-10-CM | POA: Diagnosis not present

## 2019-01-17 ENCOUNTER — Other Ambulatory Visit: Payer: Self-pay | Admitting: Orthopaedic Surgery

## 2019-01-17 DIAGNOSIS — M84375S Stress fracture, left foot, sequela: Secondary | ICD-10-CM

## 2019-02-03 ENCOUNTER — Telehealth: Payer: Self-pay
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
## 2019-02-04 ENCOUNTER — Other Ambulatory Visit: Payer: Self-pay

## 2019-02-04 ENCOUNTER — Ambulatory Visit (INDEPENDENT_AMBULATORY_CARE_PROVIDER_SITE_OTHER): Payer: BLUE CROSS/BLUE SHIELD | Admitting: Family Medicine

## 2019-02-04 ENCOUNTER — Encounter: Payer: Self-pay | Admitting: Family Medicine

## 2019-02-04 DIAGNOSIS — G44329 Chronic post-traumatic headache, not intractable: Secondary | ICD-10-CM | POA: Diagnosis not present

## 2019-02-04 DIAGNOSIS — G44309 Post-traumatic headache, unspecified, not intractable: Secondary | ICD-10-CM | POA: Insufficient documentation

## 2019-02-04 NOTE — Patient Instructions (Signed)
Good to see you  Exercises 3 times a week.   For the foot go back to shoe and try [employer] orthotics "total support" online would be great  Exercises 3 times a week.  pennsaid pinkie amount topically 2 times daily as needed.    Over the counter get  CoQ10 200mg  daily  Iron 65mg  with 500mg  of vitamin C daily  DHEA 50 mg daily for 4 weeks Phone call in 2 weeks See me again in 6ish weeks
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
## 2019-02-04 NOTE — Assessment & Plan Note (Signed)
Posttraumatic headache.  Discussed with patient at great length.  Patient does not want to take any prescription medications, do not feel that any advanced imaging is warranted at this time.  We discussed with patient about over-the-counter medications as well as ergonomics throughout the day with her doing online courses that could be more beneficial.  Patient will follow-up with me again in 8 weeks.

## 2019-02-05 ENCOUNTER — Ambulatory Visit
Admission: RE | Admit: 2019-02-05 | Discharge: 2019-02-05 | Disposition: A | Discharge status: Home / Self Care | Payer: BLUE CROSS/BLUE SHIELD | Source: Ambulatory Visit | Attending: Orthopaedic Surgery | Admitting: Orthopaedic Surgery

## 2019-02-05 DIAGNOSIS — R600 Localized edema: Secondary | ICD-10-CM | POA: Diagnosis not present

## 2019-02-05 DIAGNOSIS — M84375S Stress fracture, left foot, sequela: Secondary | ICD-10-CM

## 2019-02-17 DIAGNOSIS — M19079 Primary osteoarthritis, unspecified ankle and foot: Secondary | ICD-10-CM | POA: Diagnosis not present

## 2019-02-19 DIAGNOSIS — M7742 Metatarsalgia, left foot: Secondary | ICD-10-CM | POA: Diagnosis not present

## 2019-02-22 ENCOUNTER — Encounter: Payer: Self-pay | Admitting: Family Medicine

## 2019-03-19 ENCOUNTER — Ambulatory Visit: Payer: BLUE CROSS/BLUE SHIELD | Admitting: Family Medicine

## 2019-03-19 DIAGNOSIS — M7742 Metatarsalgia, left foot: Secondary | ICD-10-CM | POA: Diagnosis not present

## 2019-03-27 DIAGNOSIS — M7742 Metatarsalgia, left foot: Secondary | ICD-10-CM | POA: Diagnosis not present

## 2019-04-01 DIAGNOSIS — M7742 Metatarsalgia, left foot: Secondary | ICD-10-CM | POA: Diagnosis not present

## 2019-04-03 DIAGNOSIS — M7742 Metatarsalgia, left foot: Secondary | ICD-10-CM | POA: Diagnosis not present

## 2019-04-08 DIAGNOSIS — M7742 Metatarsalgia, left foot: Secondary | ICD-10-CM | POA: Diagnosis not present

## 2019-04-10 DIAGNOSIS — M7742 Metatarsalgia, left foot: Secondary | ICD-10-CM | POA: Diagnosis not present

## 2019-04-14 NOTE — Progress Notes (Deleted)
Subjective:   @VITALSMCOMMENTS @  Chief Complaint: Robyn Garcia, DOB: 1986-07-29, is a 33 y.o. female who presents for No chief complaint on file.   Injury date : *** Visit #: ***  Previous imagine.   History of Present Illness:    Concussion Self-Reported Symptom Score Symptoms rated on a scale 1-6, in last 24 hours  Headache: ***    Nausea: ***  Vomiting: ***  Balance Difficulty: ***   Dizziness: ***  Fatigue: ***  Trouble Falling Asleep: ***   Sleep More Than Usual: ***  Sleep Less Than Usual: ***  Daytime Drowsiness: ***  Photophobia: ***  Phonophobia: ***  Feeling anxious: ***  Confused: ***  Irritability: ***  Sadness: ***  Nervousness: ***  Feeling More Emotional: ***  Numbness or Tingling: ***  Feeling Slowed Down: ***  Feeling Mentally Foggy: ***  Difficulty Concentrating: ***  Difficulty Remembering: ***  Visual Problems: ***  Neck Pain: ***  Tinnitus: ***   Total Symptom Score: *** Previous Symptom Score: ***  Review of Systems:  No , visual changes, nausea, vomiting, diarrhea, constipation, dizziness, abdominal pain, skin rash, fevers, chills, night sweats, weight loss, swollen lymph nodes, body aches, joint swelling, muscle aches, chest pain, shortness of breath, mood changes.   +Headache   Review of History: Past Medical History: @PMHP @  Past Surgical History:  has no past surgical history on file. Family History: family history is not on file. no family history of autoimmune Social History:  reports that she has never smoked. She has never used smokeless tobacco. No history on file for alcohol and drug. Current Medications: has a current medication list which includes the following prescription(s): albuterol, diclofenac, mometasone, ondansetron, polyethylene glycol powder, and vitamin d (ergocalciferol). Allergies: has No Known Allergies.  Objective:    Physical Examination There were no vitals filed for this visit. General: No  apparent distress alert and oriented x3 mood and affect normal, dressed appropriately.  HEENT: Pupils equal, extraocular movements intact  Respiratory: Patient's speak in full sentences and does not appear short of breath  Cardiovascular: No lower extremity edema, non tender, no erythema  Skin: Warm dry intact with no signs of infection or rash on extremities or on axial skeleton.  Abdomen: Soft nontender  Neuro: Cranial nerves II through XII are intact, neurovascularly intact in all extremities with 2+ DTRs and 2+ pulses.  Lymph: No lymphadenopathy of posterior or anterior cervical chain or axillae bilaterally.  Gait normal with good balance and coordination.  MSK:  Non tender with full range of motion and good stability and symmetric strength and tone of shoulders, elbows, wrist,  knee and ankles bilaterally.  Psychiatric: Oriented X3, intact recent and remote memory, judgement and insight, normal mood and affect  Concussion testing performed today:  I spent *** minutes with patient discussing test and results including review of history and patient chart and  integration of patient data, interpretation of standardized test results and clinical data, clinical decision making, treatment planning and report,and interactive feedback to the patient with all of patients questions answered.    Neurocognitive testing (ImPACT):  Baseline:*** Post #1: ***   Verbal Memory Composite *** (***%) *** (***%)   Visual Memory Composite *** (***%) *** (***%)   Visual Motor Speed Composite *** (***%) *** (***%)   Reaction Time Composite *** (***%) *** (***%)   Cognitive Efficiency Index *** ***     Vestibular Screening:   Pre VOMS  HA Score: *** Pre VOMS  Dizziness Score: ***  Headache  Dizziness  Smooth Pursuits *** ***  H. Saccades *** ***  V. Saccades *** ***  H. VOR *** ***  V. VOR *** ***  Visual Motor Sensitivity *** ***  Accommodation Right: *** cm Left: *** cm *** ***  Convergence: ***  cm Divergence: *** cm *** ***   Balance Screen: ***  Additional testing performed today: { :28529}   Assessment:    No diagnosis found.  Robyn Garcia presents with the following concussion subtypes. [] Cognitive [] Cervical [] Vestibular [] Ocular [] Migraine [] Anxiety/Mood   Plan:   Action/Discussion: Reviewed diagnosis, management options, expected outcomes, and the reasons for scheduled and emergent follow-up. Questions were adequately answered. Patient expressed verbal understanding and agreement with the following plan.      Participation in school/work: Patient is cleared to return to work/school and activities of daily living without restrictions.  Patient is not cleared to return to work/school until further notice.  Patient may return to work/school on ***, with the following restrictions/supports:  Extra Time:  Take mental rest breaks during the day as needed. Check for return of symptoms when participating in any activities that require a significant amount of attention or concentration.  Allow extra time to complete tasks.  Please allow *** weeks to make up missed assignments, test, quizzes.  Visual/Vestibular Accommodations in School:  Allow patient to eat lunch in quiet environment with 1-2 classmates.  Allow patient to leave class 5 minutes before end of period to avoid busy/noisy hallway.  Please provide any supplemental learning materials (power points, lecture notes, handouts, etc) in minimum size 18 font and allow/provide any auditory supplements to learning when possible (books on tape, audio tape lectures, etc) to limit visual stress in the classroom.  Patient is cleared for auditory participation only. Patient is not cleared for homework, quizzes, or tests at this time.   Testing:  May begin taking tests/quizzes on *** with no more than one test/quiz per day.   No significant classroom or standardized testing until  ***.  Home/Extracurricular:  Lessen work/homework load to allow adequate cognitive rest. Work *** minutes with intervals of *** minute breaks (total *** hours).  Limit visual stimulants including: driving, watching television/movies, reading, using cell phone, etc. - to ensure relative visual cognitive rest. NOT cleared for video or phone games. May participate *** minutes with intervals of *** minute breaks (total *** hours).    Active Treatment Strategies:  Fueling your brain is important for recovery. It is essential to stay well hydrated, aiming for half of your body weight in fluid ounces per day (100 lbs = 50 oz). We also recommend eating breakfast to start your day and focus on a well-balanced diet containing lean protein, 'good' fats, and complex carbohydrates. See your nutrition / hydration handout for more details.   Quality sleep is vital in your concussion recovery. We encourage lots of sleep for the first 24-72 hours after injury but following this period it is important to regulate your sleep cycle. We encourage 8 hours of quality sleep per night. See your sleep handout for more details and strategies to quality sleep.  I  Treating your vestibular and visual dysfunction will decrease your recovery time and improve your symptoms. Begin your home vestibular exercise program as directed on your AVS.    Begin taking DHA supplement as directed.  .   Follow-up information:  Follow up appointment at [facility] .   Patient needs to arrive 30 minutes prior to appointment to complete the following  tests: { :28378}.    Patient Education:  Reviewed with patient the risks (i.e, a repeat concussion, post-concussion syndrome, second-impact syndrome) of returning to play prior to complete resolution, and thoroughly reviewed the signs and symptoms of concussion.Reviewed need for complete resolution of all symptoms, with rest AND exertion, prior to return to play.  Reviewed red  flags for urgent medical evaluation: worsening symptoms, nausea/vomiting, intractable headache, musculoskeletal changes, focal neurological deficits.  [facility] Concussion Care Plan, which clearly outlines the plans stated above, was given to patient.   The above was documentation by clinical scribe has been reviewed and is accurate and complete   In addition to the time spent performing tests, I spent *** min face to face w/ pt with greater than 50% of that time in counseling on:   Reviewed with patient the risks (i.e, a repeat concussion, post-concussion syndrome, second-impact syndrome) of returning to play prior to complete resolution, and thoroughly reviewed the signs and symptoms of      concussion. Reviewedf need for complete resolution of all symptoms, with rest AND exertion, prior to return to play.  Reviewed red flags for urgent medical evaluation: worsening symptoms, nausea/vomiting, intractable headache, musculoskeletal changes, focal neurological deficits.  [facility] Concussion Care Plan, which clearly outlines the plans stated above, was given to patient   After Visit Summary printed out and provided to patient as appropriate.

## 2019-04-15 ENCOUNTER — Ambulatory Visit: Payer: BLUE CROSS/BLUE SHIELD | Admitting: Family Medicine

## 2019-04-15 DIAGNOSIS — M7742 Metatarsalgia, left foot: Secondary | ICD-10-CM | POA: Diagnosis not present

## 2019-04-22 DIAGNOSIS — M7742 Metatarsalgia, left foot: Secondary | ICD-10-CM | POA: Diagnosis not present

## 2019-05-12 DIAGNOSIS — M7741 Metatarsalgia, right foot: Secondary | ICD-10-CM | POA: Diagnosis not present

## 2019-05-13 DIAGNOSIS — D3613 Benign neoplasm of peripheral nerves and autonomic nervous system of lower limb, including hip: Secondary | ICD-10-CM | POA: Diagnosis not present

## 2019-05-13 DIAGNOSIS — M79672 Pain in left foot: Secondary | ICD-10-CM | POA: Diagnosis not present

## 2019-05-13 DIAGNOSIS — M7742 Metatarsalgia, left foot: Secondary | ICD-10-CM | POA: Diagnosis not present

## 2019-05-15 DIAGNOSIS — M7742 Metatarsalgia, left foot: Secondary | ICD-10-CM | POA: Diagnosis not present

## 2019-05-21 DIAGNOSIS — M7742 Metatarsalgia, left foot: Secondary | ICD-10-CM | POA: Diagnosis not present

## 2019-07-01 DIAGNOSIS — M25572 Pain in left ankle and joints of left foot: Secondary | ICD-10-CM | POA: Diagnosis not present

## 2019-07-04 DIAGNOSIS — R600 Localized edema: Secondary | ICD-10-CM | POA: Diagnosis not present

## 2019-07-04 DIAGNOSIS — M25475 Effusion, left foot: Secondary | ICD-10-CM | POA: Diagnosis not present

## 2019-07-15 DIAGNOSIS — M7742 Metatarsalgia, left foot: Secondary | ICD-10-CM | POA: Diagnosis not present

## 2019-07-15 DIAGNOSIS — M79672 Pain in left foot: Secondary | ICD-10-CM | POA: Diagnosis not present

## 2019-07-15 DIAGNOSIS — M719 Bursopathy, unspecified: Secondary | ICD-10-CM | POA: Diagnosis not present

## 2020-01-20 ENCOUNTER — Other Ambulatory Visit: Payer: Self-pay | Admitting: Family Medicine

## 2020-01-20 DIAGNOSIS — R109 Unspecified abdominal pain: Secondary | ICD-10-CM

## 2020-01-21 ENCOUNTER — Ambulatory Visit
Admission: RE | Admit: 2020-01-21 | Discharge: 2020-01-21 | Disposition: A | Discharge status: Home / Self Care | Payer: BC Managed Care – PPO | Source: Ambulatory Visit | Attending: Family Medicine | Admitting: Family Medicine

## 2020-01-21 DIAGNOSIS — R109 Unspecified abdominal pain: Secondary | ICD-10-CM

## 2020-01-22 ENCOUNTER — Ambulatory Visit: Payer: BC Managed Care – PPO | Attending: Internal Medicine

## 2020-01-22 DIAGNOSIS — Z20822 Contact with and (suspected) exposure to covid-19: Secondary | ICD-10-CM

## 2020-01-23 LAB — NOVEL CORONAVIRUS, NAA: SARS-CoV-2, NAA: NOT DETECTED

## 2020-05-11 IMAGING — CT CT ABD-PELV W/ CM
2 of 4 series · 16 of 46 positions shown, 18 images · IV contrast (Omni 300)
Comparison: Prior CT from 03/04/2011.

CLINICAL DATA: Initial evaluation for acute abdominal pain, concern
for bowel obstruction.

EXAM:
CT ABDOMEN AND PELVIS WITH CONTRAST
TECHNIQUE: Multidetector CT imaging of the abdomen and pelvis was performed
using the standard protocol following bolus administration of
intravenous contrast.
CONTRAST:  100mL OMNIPAQUE IOHEXOL 300 MG/ML  SOLN

[Series 3: a/p w/ 5mm · axial · 0.76mm/px · z∈[+589,+1059]mm · 13 of 104 slices shown, 15 images]
[im 5/104  soft-tissue]
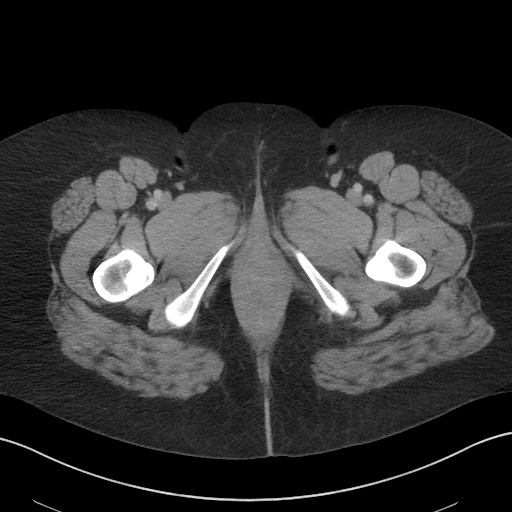
[im 5/104  bone]
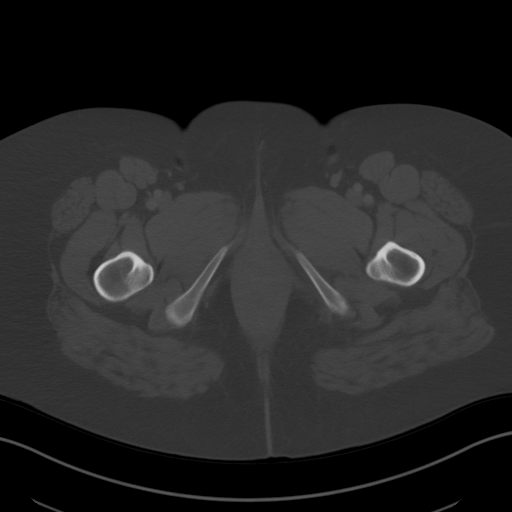
[im 13/104  soft-tissue]
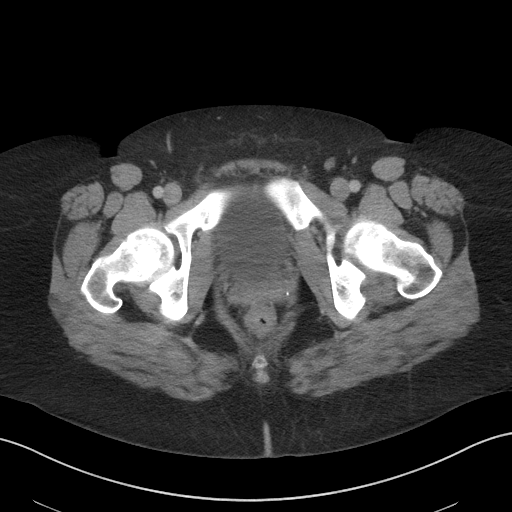
[im 22/104  soft-tissue]
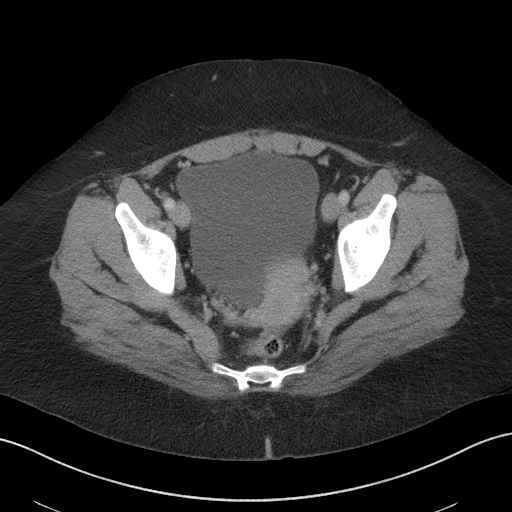
[im 31/104  soft-tissue]
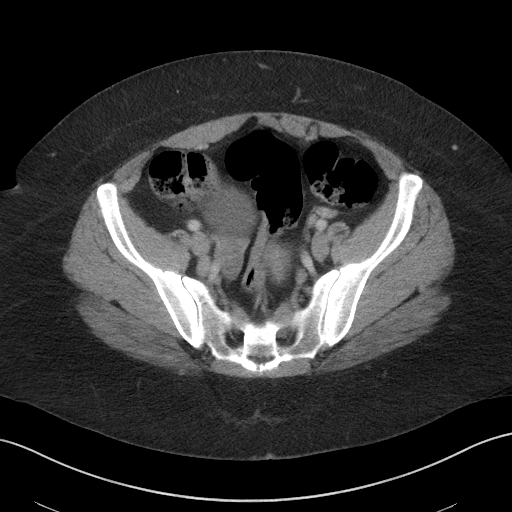
[im 35/104  soft-tissue]
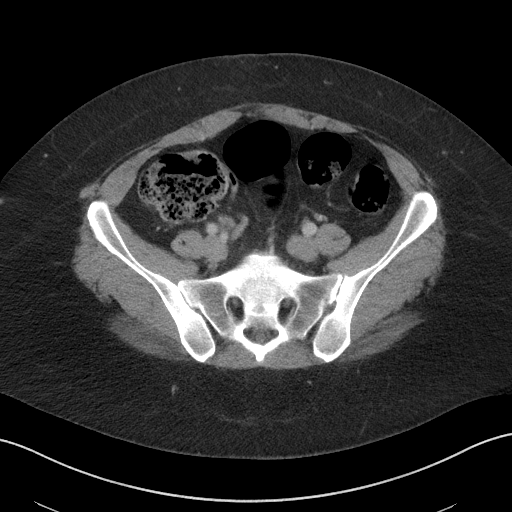
[im 43/104  soft-tissue]
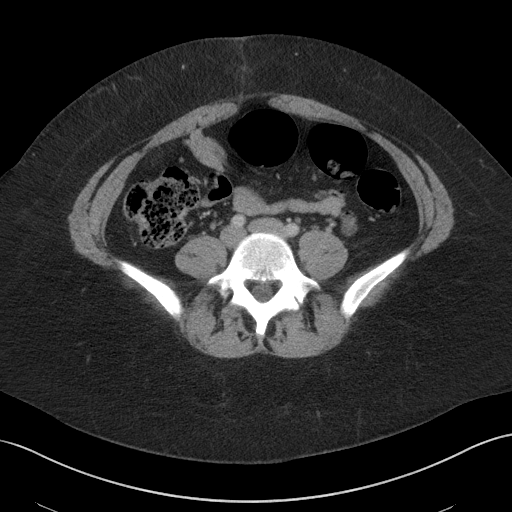
[im 52/104  soft-tissue]
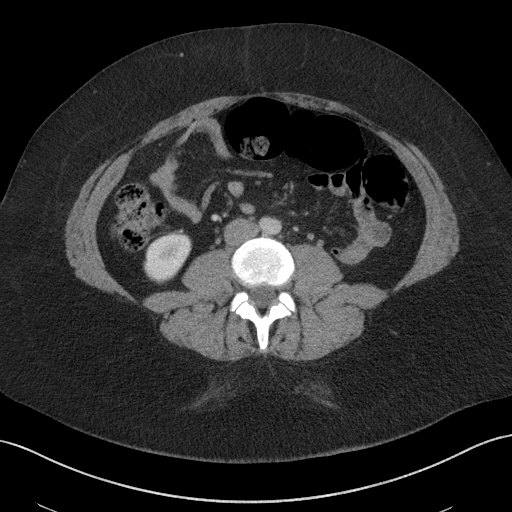
[im 61/104  soft-tissue]
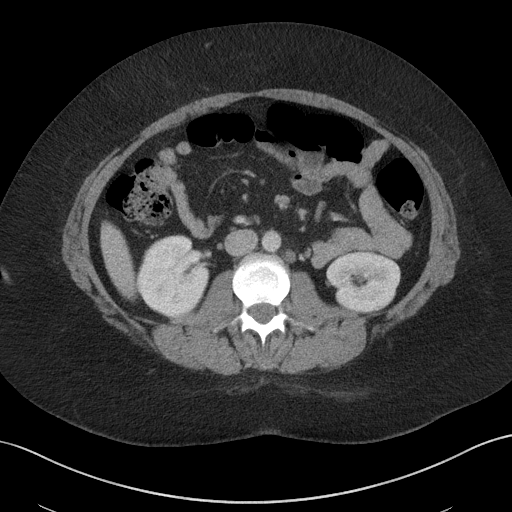
[im 69/104  soft-tissue]
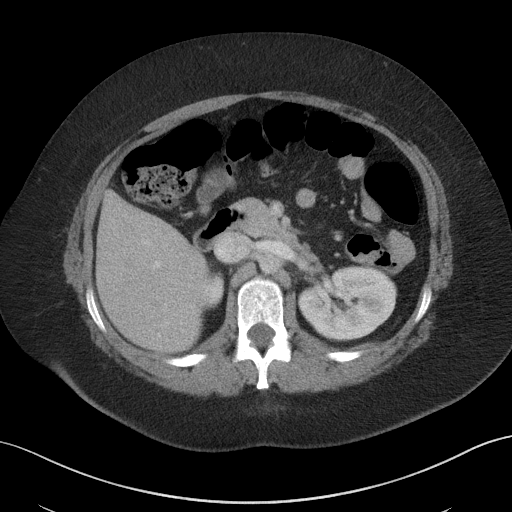
[im 69/104  bone]
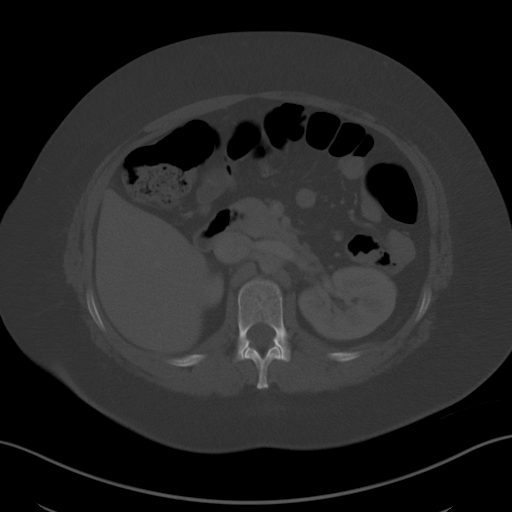
[im 73/104  soft-tissue]
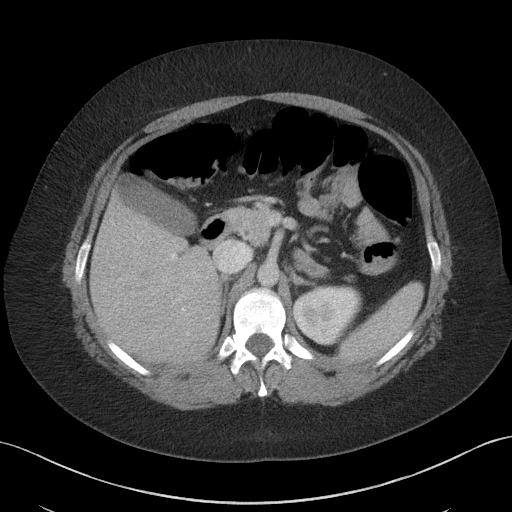
[im 82/104  soft-tissue]
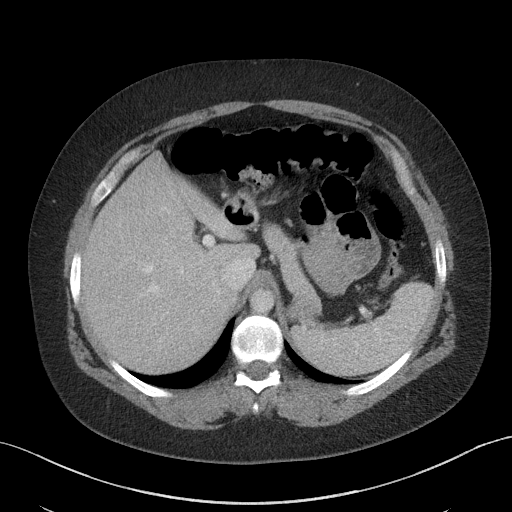
[im 91/104  soft-tissue]
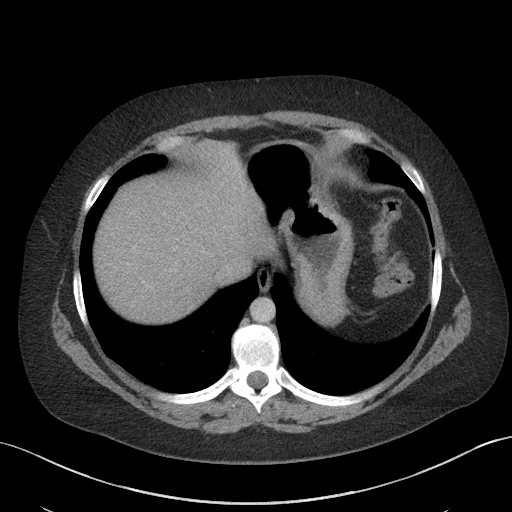
[im 99/104  soft-tissue]
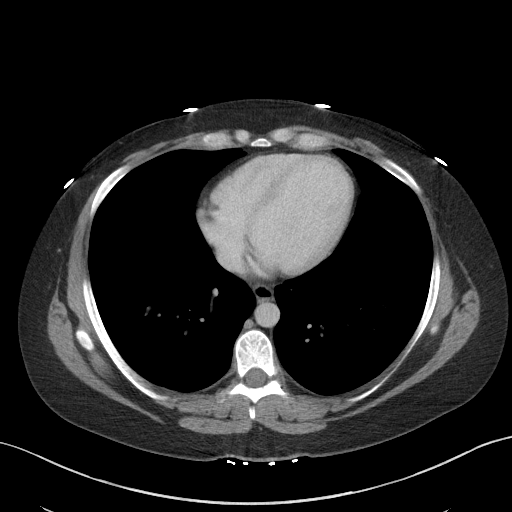

[Series 6: a/p w/ cor · coronal · 0.75mm/px · 3 of 151 slices shown]
[im 51/151  soft-tissue]
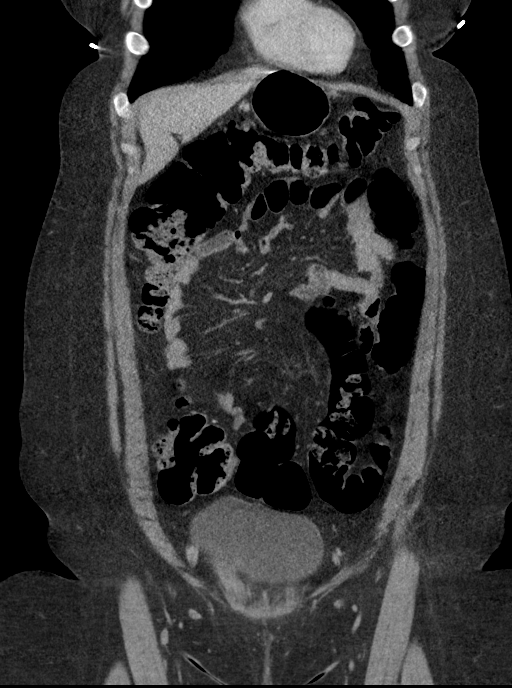
[im 67/151  soft-tissue]
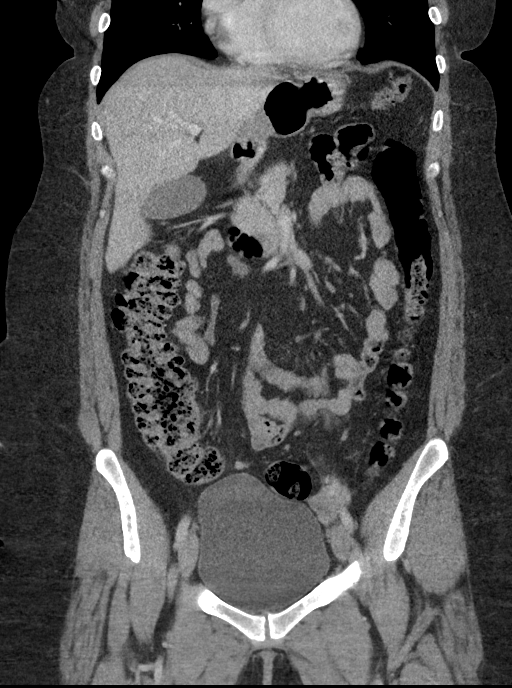
[im 84/151  soft-tissue]
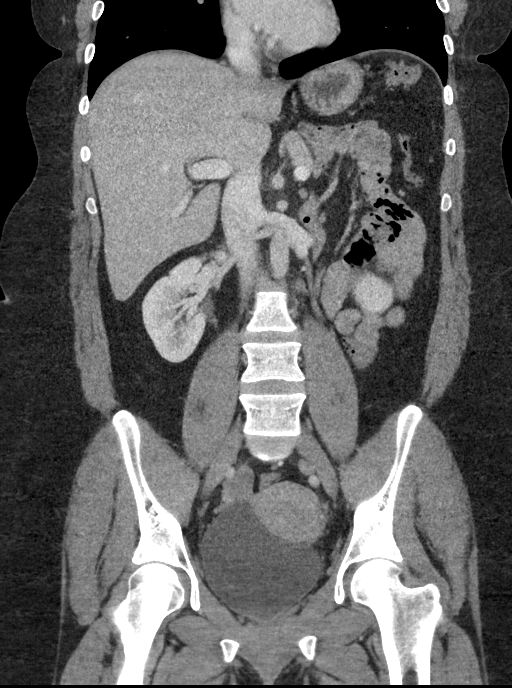

[16 of 46 positions shown; findings below may reference images not displayed]

FINDINGS: Lower chest: Visualized lung bases are clear.

Hepatobiliary: Liver demonstrates a normal contrast enhanced
appearance. Gallbladder within normal limits. No biliary dilatation.

Pancreas: Pancreas within normal limits.

Spleen: Spleen within normal limits.

Adrenals/Urinary Tract: Adrenal glands are normal. Kidneys cyst
equal in size with symmetric enhancement. No nephrolithiasis,
hydronephrosis, or focal enhancing renal mass. No hydroureter.
Bladder moderately distended without abnormality.

Stomach/Bowel: Stomach within normal limits. No evidence for bowel
obstruction. Appendix is normal. No abnormal wall thickening,
mucosal enhancement, or inflammatory fat stranding seen about the
bowels. Moderate stool within the ascending colon.

Vascular/Lymphatic: Normal intravascular enhancement seen throughout
the intra-abdominal aorta. Mesenteric vessels patent proximally. No
adenopathy.

Reproductive: Uterus and ovaries within normal limits for age.

Other: No free air or fluid.

Musculoskeletal: No acute osseus abnormality. No worrisome lytic or
blastic osseous lesions.
IMPRESSION: No CT evidence for acute intra-abdominal or pelvic process. Moderate
stool within the ascending colon without evidence for obstruction.

## 2020-08-25 ENCOUNTER — Encounter (HOSPITAL_COMMUNITY): Payer: Self-pay

## 2020-08-25 ENCOUNTER — Other Ambulatory Visit: Payer: Self-pay

## 2020-08-25 ENCOUNTER — Emergency Department (HOSPITAL_COMMUNITY)
Admission: EM | Admit: 2020-08-25 | Discharge: 2020-08-25 | Disposition: A | Discharge status: Home / Self Care | Payer: BC Managed Care – PPO | Attending: Emergency Medicine | Admitting: Emergency Medicine

## 2020-08-25 DIAGNOSIS — R109 Unspecified abdominal pain: Secondary | ICD-10-CM | POA: Diagnosis present

## 2020-08-25 DIAGNOSIS — R110 Nausea: Secondary | ICD-10-CM | POA: Diagnosis not present

## 2020-08-25 DIAGNOSIS — Z5321 Procedure and treatment not carried out due to patient leaving prior to being seen by health care provider: Secondary | ICD-10-CM | POA: Diagnosis not present

## 2020-08-25 NOTE — ED Notes (Signed)
Patient was called to reassess vital signs but no response. X1

## 2020-08-25 NOTE — ED Triage Notes (Signed)
Pt arrived via walk in,c/o pain and nausea since last night. Had a colectomy 9/24, has been feeling okay until last night. Pt has not been taking pain medications prescribed. States she took some zofran and reglan this morning with no relief.

## 2021-01-24 ENCOUNTER — Other Ambulatory Visit: Payer: Self-pay

## 2021-01-24 ENCOUNTER — Encounter (HOSPITAL_COMMUNITY): Payer: Self-pay | Admitting: Emergency Medicine

## 2021-01-24 ENCOUNTER — Emergency Department (HOSPITAL_COMMUNITY)
Admission: EM | Admit: 2021-01-24 | Discharge: 2021-01-24 | Disposition: A | Discharge status: Home / Self Care | Payer: BC Managed Care – PPO | Attending: Emergency Medicine | Admitting: Emergency Medicine

## 2021-01-24 DIAGNOSIS — R197 Diarrhea, unspecified: Secondary | ICD-10-CM | POA: Diagnosis not present

## 2021-01-24 DIAGNOSIS — Z5321 Procedure and treatment not carried out due to patient leaving prior to being seen by health care provider: Secondary | ICD-10-CM | POA: Insufficient documentation

## 2021-01-24 DIAGNOSIS — R110 Nausea: Secondary | ICD-10-CM | POA: Insufficient documentation

## 2021-01-24 DIAGNOSIS — R1010 Upper abdominal pain, unspecified: Secondary | ICD-10-CM | POA: Diagnosis present

## 2021-01-24 LAB — COMPREHENSIVE METABOLIC PANEL
ALT: 19 U/L (ref 0–44)
AST: 22 U/L (ref 15–41)
Albumin: 4 g/dL (ref 3.5–5.0)
Alkaline Phosphatase: 85 U/L (ref 38–126)
Anion gap: 12 (ref 5–15)
BUN: 12 mg/dL (ref 6–20)
CO2: 22 mmol/L (ref 22–32)
Calcium: 9.4 mg/dL (ref 8.9–10.3)
Chloride: 103 mmol/L (ref 98–111)
Creatinine, Ser: 0.7 mg/dL (ref 0.44–1.00)
GFR, Estimated: >60 mL/min (ref >60)
Glucose, Bld: 111 mg/dL — ABNORMAL HIGH (ref 70–99)
Potassium: 4.2 mmol/L (ref 3.5–5.1)
Sodium: 137 mmol/L (ref 135–145)
Total Bilirubin: 0.6 mg/dL (ref 0.3–1.2)
Total Protein: 7.3 g/dL (ref 6.5–8.1)

## 2021-01-24 LAB — I-STAT BETA HCG BLOOD, ED (MC, WL, AP ONLY): I-stat hCG, quantitative: <5 m[IU]/mL (ref <5)

## 2021-01-24 LAB — CBC
HCT: 45.6 % (ref 36.0–46.0)
Hemoglobin: 14.2 g/dL (ref 12.0–15.0)
MCH: 26.9 pg (ref 26.0–34.0)
MCHC: 31.1 g/dL (ref 30.0–36.0)
MCV: 86.4 fL (ref 80.0–100.0)
Platelets: 262 10*3/uL (ref 150–400)
RBC: 5.28 MIL/uL — ABNORMAL HIGH (ref 3.87–5.11)
RDW: 13 % (ref 11.5–15.5)
WBC: 9.5 10*3/uL (ref 4.0–10.5)
nRBC: 0 % (ref 0.0–0.2)

## 2021-01-24 LAB — LIPASE, BLOOD: Lipase: 34 U/L (ref 11–51)

## 2021-01-24 MED ORDER — ONDANSETRON 4 MG PO TBDP
4.0000 mg | ORAL_TABLET | Freq: Once | ORAL | Status: AC
  Administered 2021-01-24: 4 mg via ORAL
  Filled 2021-01-24: qty 1

## 2021-01-24 NOTE — ED Triage Notes (Signed)
Patient reports upper abdominal pain with diarrhea and nausea this evening , no fever or chills.

## 2021-01-24 NOTE — ED Notes (Signed)
Pt left due to not being seen quick enough 

## 2023-01-26 ENCOUNTER — Ambulatory Visit: Payer: 59 | Admitting: Psychiatry
